# Patient Record
Sex: Male | Born: 1950
Health system: Southern US, Community
[De-identification: ages and names within clinical notes are randomized; demographics above are authoritative.]

## PROBLEM LIST (undated history)

## (undated) DIAGNOSIS — I10 Essential (primary) hypertension: Secondary | ICD-10-CM

---

## 2016-06-14 ENCOUNTER — Encounter (HOSPITAL_BASED_OUTPATIENT_CLINIC_OR_DEPARTMENT_OTHER): Payer: Self-pay | Admitting: Emergency Medicine

## 2016-06-14 ENCOUNTER — Emergency Department (HOSPITAL_BASED_OUTPATIENT_CLINIC_OR_DEPARTMENT_OTHER): Payer: BLUE CROSS/BLUE SHIELD

## 2016-06-14 ENCOUNTER — Emergency Department (HOSPITAL_BASED_OUTPATIENT_CLINIC_OR_DEPARTMENT_OTHER)
Admission: EM | Admit: 2016-06-14 | Discharge: 2016-06-14 | Disposition: A | Discharge status: Home / Self Care | Payer: BLUE CROSS/BLUE SHIELD | Attending: Emergency Medicine | Admitting: Emergency Medicine

## 2016-06-14 DIAGNOSIS — Z79899 Other long term (current) drug therapy: Secondary | ICD-10-CM | POA: Insufficient documentation

## 2016-06-14 DIAGNOSIS — R1030 Lower abdominal pain, unspecified: Secondary | ICD-10-CM | POA: Insufficient documentation

## 2016-06-14 DIAGNOSIS — R109 Unspecified abdominal pain: Secondary | ICD-10-CM

## 2016-06-14 DIAGNOSIS — I10 Essential (primary) hypertension: Secondary | ICD-10-CM | POA: Insufficient documentation

## 2016-06-14 HISTORY — DX: Essential (primary) hypertension: I10

## 2016-06-14 MED ORDER — IBUPROFEN 800 MG PO TABS: 800.0000 mg | ORAL_TABLET | Freq: Three times a day (TID) | ORAL | 0 refills | Status: AC | PRN

## 2016-06-14 MED ORDER — CYCLOBENZAPRINE HCL 5 MG PO TABS: 5.0000 mg | ORAL_TABLET | Freq: Three times a day (TID) | ORAL | 0 refills | Status: AC | PRN

## 2016-06-14 MED FILL — CYCLOBENZAPRINE 10 MG TAB: 10 | 4 days supply | Qty: 10 | Fill #0

## 2016-06-14 MED FILL — IBUPROFEN 800 MG TABLET: 800 | 7 days supply | Qty: 21 | Fill #0

## 2016-06-14 NOTE — ED Triage Notes (Signed)
Pt reports intermittent lower abd pain. Pt states he has been experiencing this for a year. States the pain is sharp and can last for several hours. Reports yesterday was the worst episode of this and felt like he needed medical attention. Denies vomiting or diarrhea. Pt states he does heavy lifting at work and wonders if it is related. Pt is currently pain free.

## 2016-06-14 NOTE — ED Provider Notes (Signed)
[facility]   CSN: [id number] Arrival date & time: 06/14/16  [id number]  First Provider Contact:  First MD Initiated Contact with Patient 06/14/16 [phone]        History   Chief Complaint Chief Complaint  Patient presents with  . Abdominal Pain    HPI Bruce Fernandez is a 65 y.o. male hx of HTN, Here presenting with intermittent abdominal pain. Intermittent lower abdominal pain for the last year or so. States that is sharp and lasts for several hours. He states that it is worse after he comes home from work. 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 He states that yesterday after work he was doubled over in pain. He denies any vomiting or diarrhea or any weight loss. Denies any urinary symptoms. Patient has not seen primary care doctor to be evaluated for this problem family is wondering about imaging.      The history is provided by the patient.    Past Medical History:  Diagnosis Date  . Hypertension     There are no active problems to display for this patient.   History reviewed. No pertinent surgical history.     Home Medications    Prior to Admission medications   Medication Sig Start Date End Date Taking? Authorizing Provider  hydrochlorothiazide (HYDRODIURIL) 25 MG tablet Take 25 mg by mouth daily.   Yes Historical Provider, MD    Family History No family history on file.  Social History Social History  Substance Use Topics  . Smoking status: Never Smoker  . Smokeless tobacco: Never Used  . Alcohol use Not on file     Allergies   Review of patient's allergies indicates no known allergies.   Review of Systems Review of Systems  Gastrointestinal: Positive for abdominal pain.  All other systems reviewed and are negative.    Physical Exam Updated Vital Signs BP 114/74 (BP Location: Left Arm)   Pulse (!) 59   Temp 98.2 F (36.8 C) (Oral)   Resp 16   Ht 6' (1.829 m)   Wt 150 lb (68 kg)   SpO2 99%   BMI 20.34 kg/m    Physical Exam  Constitutional: He is oriented to person, place, and time. He appears well-developed and well-nourished.  Well appearing, pain free   HENT:  Head: Normocephalic.  Mouth/Throat: Oropharynx is clear and moist.  Eyes: EOM are normal. Pupils are equal, round, and reactive to light.  Neck: Normal range of motion. Neck supple.  Cardiovascular: Normal rate, regular rhythm and normal heart sounds.   Pulmonary/Chest: Effort normal and breath sounds normal. No respiratory distress. He has no wheezes. He has no rales.  Abdominal: Soft. Bowel sounds are normal. He exhibits no distension. There is no tenderness. There is no guarding.  Nontender, no CVAT   Musculoskeletal: Normal range of motion.  Neurological: He is alert and oriented to person, place, and time.  Skin: Skin is warm.  Psychiatric: He has a normal mood and affect.  Nursing note and vitals reviewed.    ED Treatments / Results  Labs (all labs ordered are listed, but only abnormal results are displayed) Labs Reviewed - No data to display  EKG  EKG Interpretation None       Radiology Dg Abd 2 Views  Result Date: 06/14/2016 CLINICAL DATA:  Abdominal pain, vomiting last night EXAM: ABDOMEN - 2 VIEW COMPARISON:  None FINDINGS: Lung bases clear. Nonobstructive bowel gas pattern. No bowel dilatation, bowel wall  thickening, or free air. Bones unremarkable. No urine tract calcification. IMPRESSION: Normal exam. Electronically Signed   By: Ulyses Southward M.D.   On: 06/14/2016 10:25    Procedures Procedures (including critical care time)  Medications Ordered in ED Medications - No data to display   Initial Impression / Assessment and Plan / ED Course  I have reviewed the triage vital signs and the nursing notes.  Pertinent labs & imaging results that were available during my care of the patient were reviewed by me and considered in my medical decision making (see chart for details).  Clinical Course    Bruce Fernandez is a 65 y.o. male here with intermittent abdominal pain for a year. No weight loss, vitals stable, nontender abdomen. No urinary symptoms or vomiting. No obvious hernias. I feel that he likely had some muscle strain. Family requesting imaging and I feel that it is not indicated. He is sometimes constipated so getting xray is reasonable. This has been going on for a year and if he has persistent pain, can get worked up outpatient with PCP.   10:34 AM Xray unremarkable. Pain free. Likely muscle strain. Recommend motrin, flexeril prn. If pain recur or if he has weight loss, vomiting, return to ED for further evaluation. Otherwise, can follow up with PCP for outpatient workup    Final Clinical Impressions(s) / ED Diagnoses   Final diagnoses:  Abdominal pain    New Prescriptions New Prescriptions   No medications on file     Charlynne Pander, MD 06/14/16 1035

## 2016-06-14 NOTE — Discharge Instructions (Signed)
You likely strain your abdominal muscles.   Take motrin as needed for pain.   Take flexeril as needed for muscle spasms.   See your doctor. If you have persistent pain, your doctor can do further workup as needed.   Return to ER if you have severe abdominal pain, vomiting, fevers, weight loss.

## 2016-06-14 NOTE — ED Notes (Signed)
Patient transported to X-ray 

## 2016-06-14 NOTE — ED Notes (Signed)
MD at bedside. 

## 2018-01-16 IMAGING — DX DG ABDOMEN 2V
2 series · 2 of 2 positions shown · non-contrast
Comparison: None

CLINICAL DATA: Abdominal pain, vomiting last night

EXAM:
ABDOMEN - 2 VIEW

[abdomen erect]
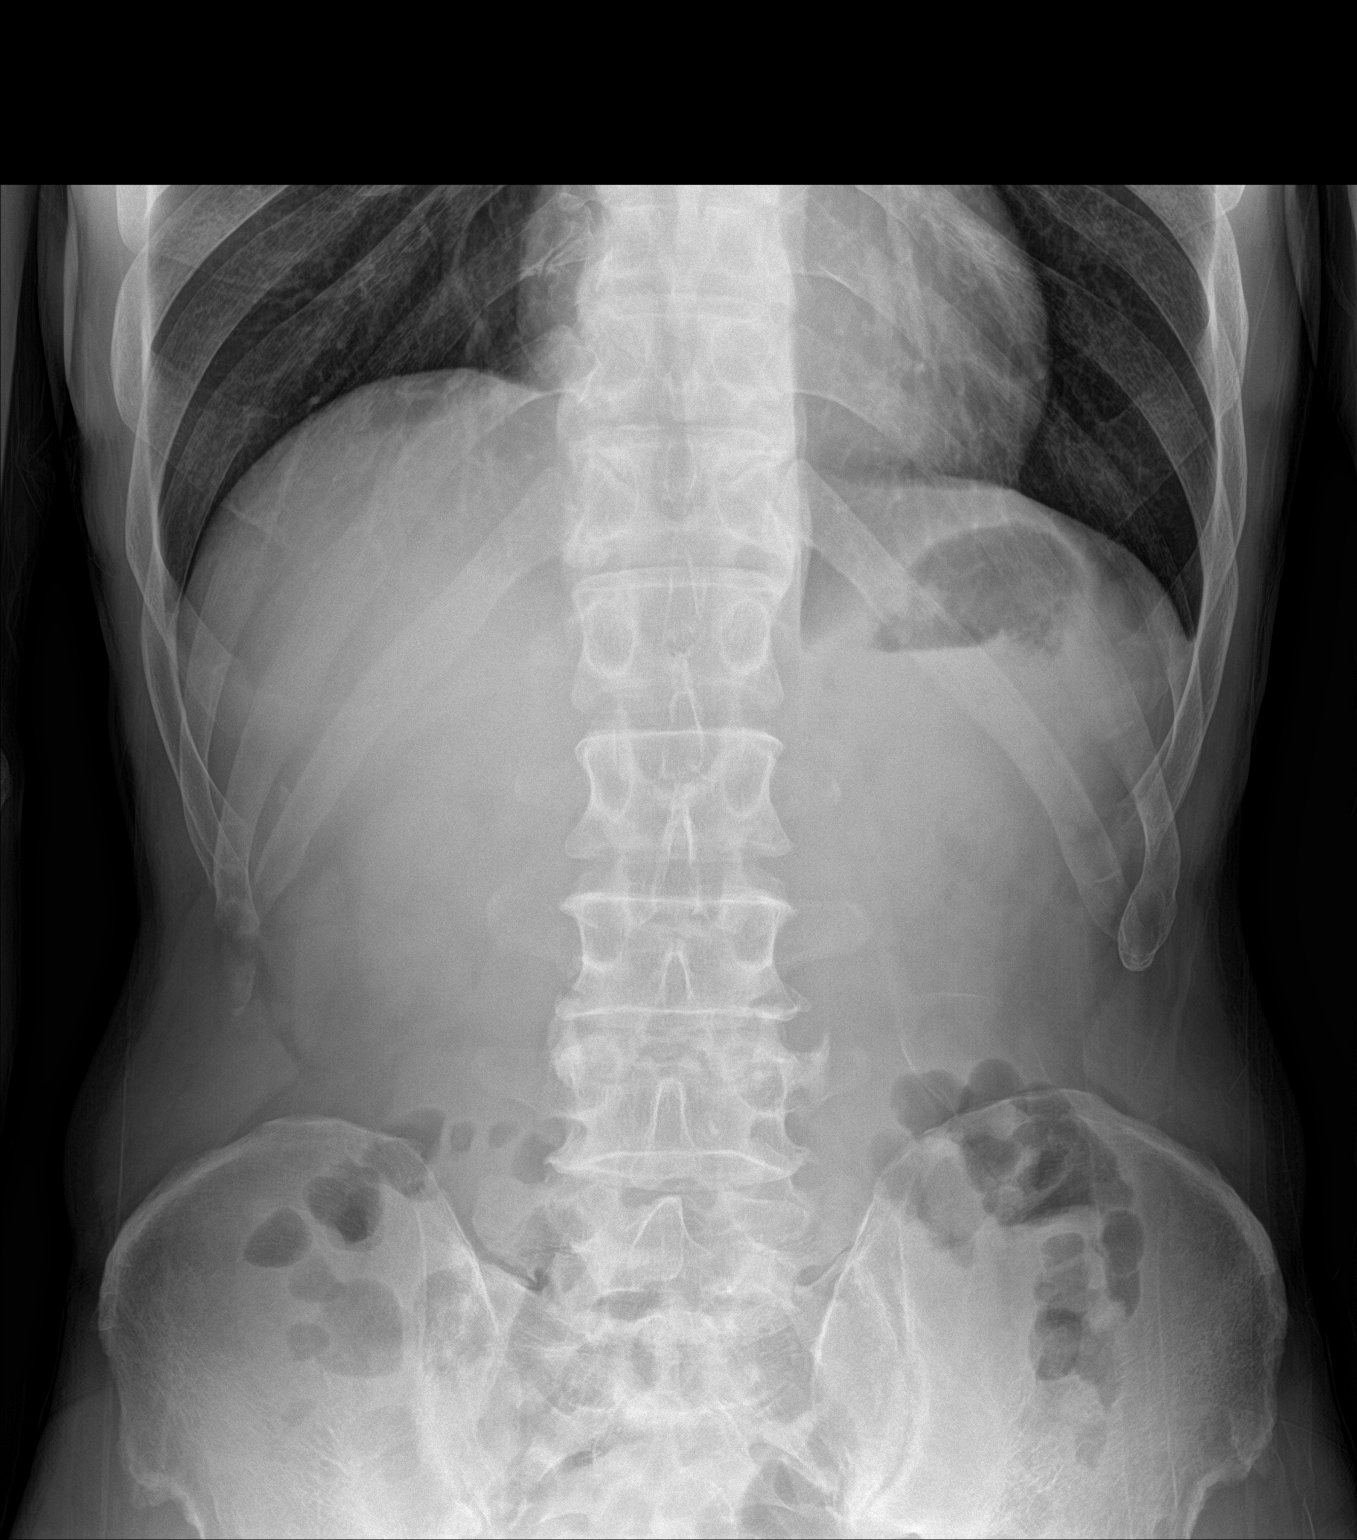

[abdomen supine]
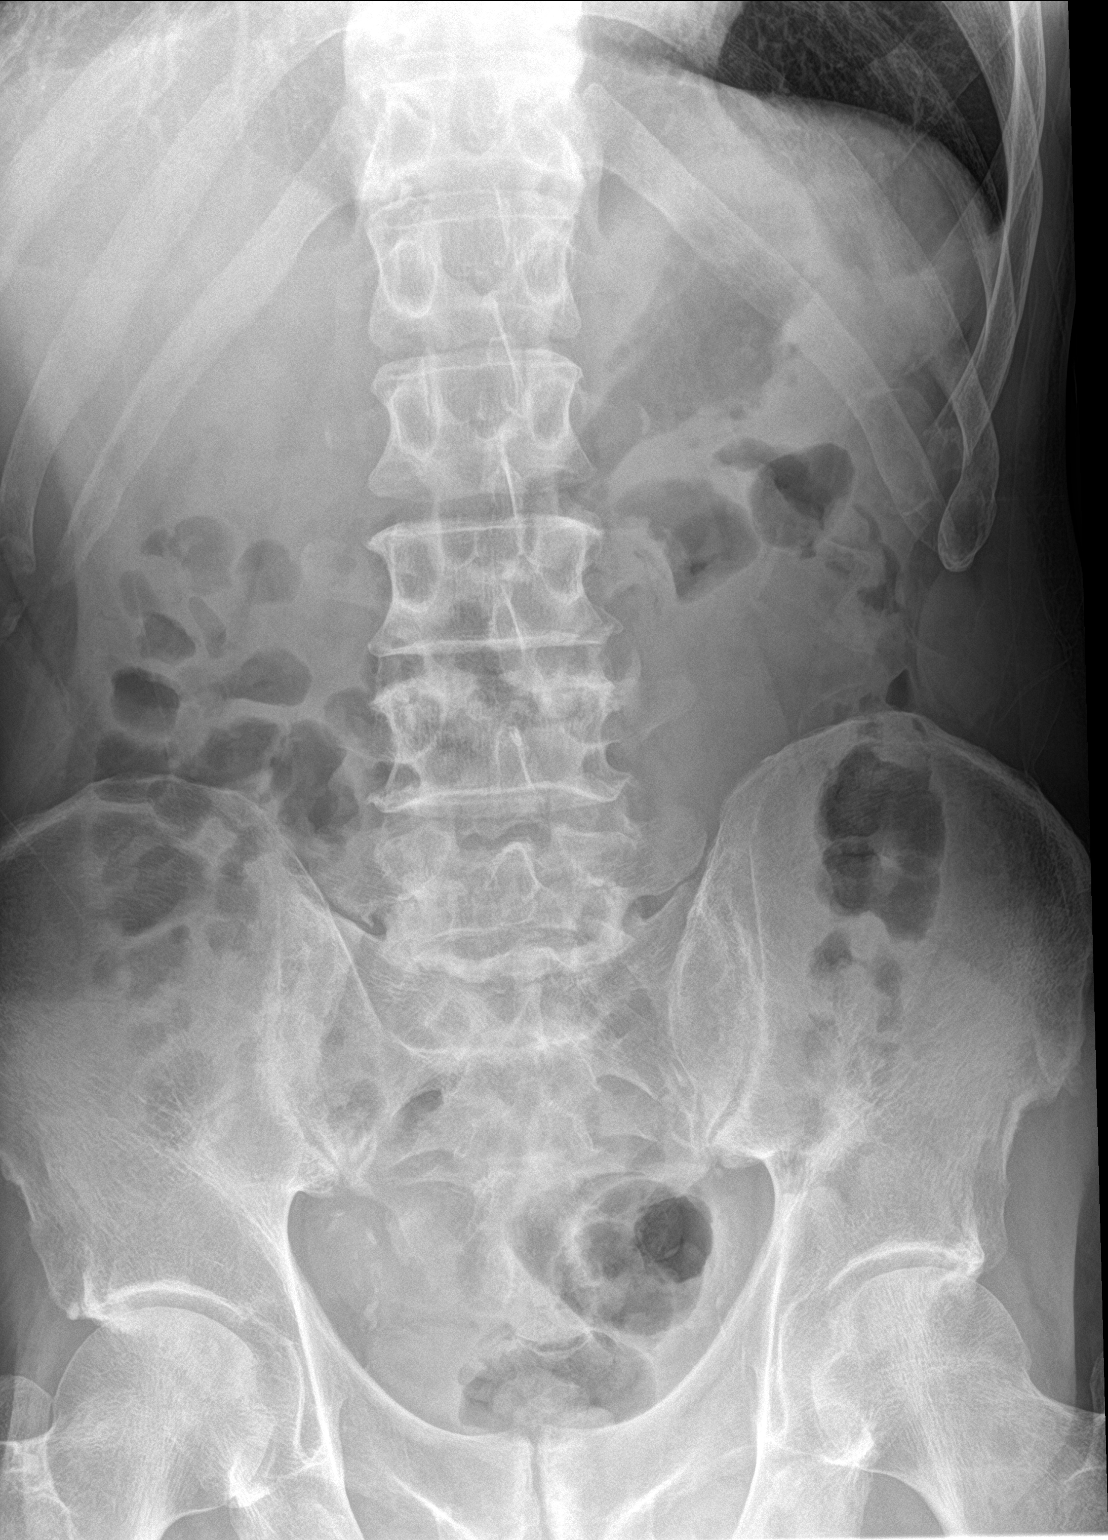

[2 of 2 positions shown; findings below may reference images not displayed]

FINDINGS: Lung bases clear.

Nonobstructive bowel gas pattern.

No bowel dilatation, bowel wall thickening, or free air.

Bones unremarkable.

No urine tract calcification.
IMPRESSION: Normal exam.
